# Patient Record
Sex: Female | Born: 1975 | Race: White | Hispanic: No | Marital: Married | Smoking: Never smoker
Health system: Southern US, Community
[De-identification: ages and names within clinical notes are randomized; demographics above are authoritative.]

## PROBLEM LIST (undated history)

## (undated) DIAGNOSIS — G43909 Migraine, unspecified, not intractable, without status migrainosus: Secondary | ICD-10-CM

## (undated) HISTORY — DX: Migraine, unspecified, not intractable, without status migrainosus: G43.909

## (undated) HISTORY — PX: ADENOIDECTOMY: SUR15

## (undated) HISTORY — PX: TONSILLECTOMY: SUR1361

---

## 2020-10-29 ENCOUNTER — Encounter (HOSPITAL_BASED_OUTPATIENT_CLINIC_OR_DEPARTMENT_OTHER): Payer: Self-pay | Admitting: Emergency Medicine

## 2020-10-29 ENCOUNTER — Emergency Department (HOSPITAL_BASED_OUTPATIENT_CLINIC_OR_DEPARTMENT_OTHER): Payer: BC Managed Care – PPO

## 2020-10-29 ENCOUNTER — Other Ambulatory Visit: Payer: Self-pay

## 2020-10-29 ENCOUNTER — Emergency Department (HOSPITAL_BASED_OUTPATIENT_CLINIC_OR_DEPARTMENT_OTHER)
Admission: EM | Admit: 2020-10-29 | Discharge: 2020-10-29 | Disposition: A | Discharge status: Home / Self Care | Payer: BC Managed Care – PPO | Attending: Emergency Medicine | Admitting: Emergency Medicine

## 2020-10-29 DIAGNOSIS — R202 Paresthesia of skin: Secondary | ICD-10-CM | POA: Diagnosis not present

## 2020-10-29 DIAGNOSIS — R519 Headache, unspecified: Secondary | ICD-10-CM | POA: Diagnosis not present

## 2020-10-29 DIAGNOSIS — R479 Unspecified speech disturbances: Secondary | ICD-10-CM | POA: Diagnosis not present

## 2020-10-29 DIAGNOSIS — H538 Other visual disturbances: Secondary | ICD-10-CM | POA: Diagnosis not present

## 2020-10-29 LAB — CBC WITH DIFFERENTIAL/PLATELET
Abs Immature Granulocytes: 0.01 10*3/uL (ref 0.00–0.07)
Basophils Absolute: 0 10*3/uL (ref 0.0–0.1)
Basophils Relative: 0 %
Eosinophils Absolute: 0.1 10*3/uL (ref 0.0–0.5)
Eosinophils Relative: 1 %
HCT: 40.9 % (ref 36.0–46.0)
Hemoglobin: 14 g/dL (ref 12.0–15.0)
Immature Granulocytes: 0 %
Lymphocytes Relative: 36 %
Lymphs Abs: 2.5 10*3/uL (ref 0.7–4.0)
MCH: 29.6 pg (ref 26.0–34.0)
MCHC: 34.2 g/dL (ref 30.0–36.0)
MCV: 86.5 fL (ref 80.0–100.0)
Monocytes Absolute: 0.4 10*3/uL (ref 0.1–1.0)
Monocytes Relative: 6 %
Neutro Abs: 3.9 10*3/uL (ref 1.7–7.7)
Neutrophils Relative %: 57 %
Platelets: 352 10*3/uL (ref 150–400)
RBC: 4.73 MIL/uL (ref 3.87–5.11)
RDW: 12.4 % (ref 11.5–15.5)
WBC: 7 10*3/uL (ref 4.0–10.5)
nRBC: 0 % (ref 0.0–0.2)

## 2020-10-29 LAB — COMPREHENSIVE METABOLIC PANEL
ALT: 19 U/L (ref 0–44)
AST: 18 U/L (ref 15–41)
Albumin: 4.1 g/dL (ref 3.5–5.0)
Alkaline Phosphatase: 43 U/L (ref 38–126)
Anion gap: 7 (ref 5–15)
BUN: 12 mg/dL (ref 6–20)
CO2: 24 mmol/L (ref 22–32)
Calcium: 8.9 mg/dL (ref 8.9–10.3)
Chloride: 107 mmol/L (ref 98–111)
Creatinine, Ser: 0.8 mg/dL (ref 0.44–1.00)
GFR, Estimated: >60 mL/min (ref >60)
Glucose, Bld: 140 mg/dL — ABNORMAL HIGH (ref 70–99)
Potassium: 3.3 mmol/L — ABNORMAL LOW (ref 3.5–5.1)
Sodium: 138 mmol/L (ref 135–145)
Total Bilirubin: 0.6 mg/dL (ref 0.3–1.2)
Total Protein: 7.1 g/dL (ref 6.5–8.1)

## 2020-10-29 LAB — CBG MONITORING, ED: Glucose-Capillary: 139 mg/dL — ABNORMAL HIGH (ref 70–99)

## 2020-10-29 NOTE — ED Provider Notes (Signed)
[facility] EMERGENCY DEPARTMENT Provider Note   CSN: [id number] Arrival date & time: 10/29/20  1936     History Chief Complaint  Patient presents with  . Neurologic Problem    Lindsay Farmer is a 45 y.o. female.  Patient states that several hours ago she started to have some spots in her eyes bilaterally and has some tingling in her hands and eventually developed some issues with talking and then developed a left-sided headache.  Has history of migraines.  Symptoms have now resolved.  She denies any weakness.  No ongoing vision issues.  The history is provided by the patient.  Headache Location: left forehead. Radiates to:  Does not radiate Severity currently:  0/10 Onset quality:  Gradual Duration:  2 hours Progression:  Resolved Chronicity:  New Relieved by:  Nothing Worsened by:  Nothing Associated symptoms: blurred vision, numbness (tingling) and paresthesias   Associated symptoms: no abdominal pain, no back pain, no cough, no dizziness, no ear pain, no eye pain, no fever, no photophobia, no seizures, no sore throat, no vomiting and no weakness       History reviewed. No pertinent past medical history.  There are no problems to display for this patient.   History reviewed. No pertinent surgical history.   OB History   No obstetric history on file.     History reviewed. No pertinent family history.  Social History   Tobacco Use  . Smoking status: Never    Passive exposure: Past  . Smokeless tobacco: Never  Vaping Use  . Vaping Use: Never used  Substance Use Topics  . Drug use: Never    Home Medications Prior to Admission medications   Medication Sig Start Date End Date Taking? Authorizing Provider  ALTAVERA 0.15-30 MG-MCG tablet Take 1 tablet by mouth daily. 08/22/20   [provider]    Allergies    Cephalexin  Review of Systems   Review of Systems  Constitutional:  Negative for chills and fever.  HENT:  Negative for ear  pain and sore throat.   Eyes:  Positive for blurred vision and visual disturbance. Negative for photophobia, pain, redness and itching.  Respiratory:  Negative for cough and shortness of breath.   Cardiovascular:  Negative for chest pain and palpitations.  Gastrointestinal:  Negative for abdominal pain and vomiting.  Genitourinary:  Negative for dysuria and hematuria.  Musculoskeletal:  Negative for arthralgias and back pain.  Skin:  Negative for color change and rash.  Neurological:  Positive for speech difficulty, numbness (tingling), headaches and paresthesias. Negative for dizziness, tremors, seizures, syncope, facial asymmetry, weakness and light-headedness.  All other systems reviewed and are negative.  Physical Exam Updated Vital Signs BP 126/90   Pulse 81   Temp 99.1 F (37.3 C) (Oral)   Resp 20   Ht 5\' 3"  (1.6 m)   Wt 71.4 kg   LMP 10/06/2020 (Exact Date)   SpO2 100%   BMI 27.88 kg/m   Physical Exam Vitals and nursing note reviewed.  Constitutional:      General: She is not in acute distress.    Appearance: She is well-developed. She is not ill-appearing.  HENT:     Head: Normocephalic and atraumatic.     Nose: Nose normal.     Mouth/Throat:     Mouth: Mucous membranes are moist.  Eyes:     Extraocular Movements: Extraocular movements intact.     Conjunctiva/sclera: Conjunctivae normal.     Pupils: Pupils are equal, round,  and reactive to light.  Cardiovascular:     Rate and Rhythm: Normal rate and regular rhythm.     Pulses: Normal pulses.     Heart sounds: Normal heart sounds. No murmur heard. Pulmonary:     Effort: Pulmonary effort is normal. No respiratory distress.     Breath sounds: Normal breath sounds.  Abdominal:     Palpations: Abdomen is soft.     Tenderness: There is no abdominal tenderness.  Musculoskeletal:     Cervical back: Normal range of motion and neck supple.  Skin:    General: Skin is warm and dry.     Capillary Refill: Capillary  refill takes less than 2 seconds.  Neurological:     General: No focal deficit present.     Mental Status: She is alert and oriented to person, place, and time.     Cranial Nerves: No cranial nerve deficit.     Sensory: No sensory deficit.     Motor: No weakness.     Coordination: Coordination normal.     Gait: Gait normal.     Comments: 5+ out of 5 strength throughout, normal sensation, no drift, normal finger-to-nose finger, normal speech, no visual field deficit  Psychiatric:        Mood and Affect: Mood normal.    ED Results / Procedures / Treatments   Labs (all labs ordered are listed, but only abnormal results are displayed) Labs Reviewed  COMPREHENSIVE METABOLIC PANEL - Abnormal; Notable for the following components:      Result Value   Potassium 3.3 (*)    Glucose, Bld 140 (*)    All other components within normal limits  CBG MONITORING, ED - Abnormal; Notable for the following components:   Glucose-Capillary 139 (*)    All other components within normal limits  CBC WITH DIFFERENTIAL/PLATELET    EKG EKG Interpretation  Date/Time:  Thursday October 29 2020 20:06:08 EDT Ventricular Rate:  74 PR Interval:  143 QRS Duration: 89 QT Interval:  391 QTC Calculation: 434 R Axis:   82 Text Interpretation: Sinus rhythm Confirmed by Virgina Norfolk ([id number]) on 10/29/2020 8:23:17 PM  Radiology CT Head Wo Contrast  Result Date: 10/29/2020 CLINICAL DATA:  Headache, blurred vision. Transient aphasia, right hand tingling and left supraorbital headache. History of migraines. EXAM: CT HEAD WITHOUT CONTRAST TECHNIQUE: Contiguous axial images were obtained from the base of the skull through the vertex without intravenous contrast. COMPARISON:  None. FINDINGS: Brain: No evidence of acute infarction, hemorrhage, hydrocephalus, extra-axial collection, visible mass lesion or mass effect. Basal cisterns are patent. Midline intracranial structures are unremarkable. Cerebellar tonsils are normally  positioned. Vascular: No hyperdense vessel or unexpected calcification. Skull: No calvarial fracture or suspicious osseous lesion. No scalp swelling or hematoma. Sinuses/Orbits: Paranasal sinuses and mastoid air cells are predominantly clear. Other: None. IMPRESSION: No acute intracranial abnormality. Electronically Signed   By: Kreg Shropshire M.D.   On: 10/29/2020 20:28    Procedures Procedures   Medications Ordered in ED Medications - No data to display  ED Course  I have reviewed the triage vital signs and the nursing notes.  Pertinent labs & imaging results that were available during my care of the patient were reviewed by me and considered in my medical decision making (see chart for details).    MDM Rules/Calculators/A&P                          Lunabelle Oatley is here  with neurologic issue.  Patient 45 years old.  No medical history.  Normal vitals.  No fever.  Has a history of migraines long time ago but has not had any in a long time.  She started to have some floaters/spots in both of her eyes earlier this afternoon.  Then she started to have some tingling in her right hand and then was having some issues with speech and then shortly after that developed a headache over her left eye once all of her other symptoms resolved.  Right now she is not having any headache.  She is completely neurologically intact without any symptoms.  Overall seems like likely complex migraine.  She has no stroke risk factors.  History is not really consistent with a TIA and overall atypical story for it.  Could be stress or anxiety related.  Lab work showed no significant anemia, no electrolyte abnormality.  Head CT normal.  Asymptomatic throughout my care.  Given reassurance and recommend follow-up with primary care doctor.  Discharged in good condition.  This chart was dictated using voice recognition software.  Despite best efforts to proofread,  errors can occur which can change the documentation meaning.    Final Clinical Impression(s) / ED Diagnoses Final diagnoses:  Nonintractable headache, unspecified chronicity pattern, unspecified headache type    Rx / DC Orders ED Discharge Orders     None        Virgina Norfolk, DO 10/29/20 2350

## 2020-10-29 NOTE — ED Triage Notes (Signed)
Patient arrived via POV c/o stroke like symptoms starting at approximately 1515, with blurred vision and resolving by 1630. Patient experienced aphasia, tingling of right hand and HA above left eye. Patient denies any pain at this time. Patient is AO x 4, VS WDL, normal gait.

## 2022-04-27 ENCOUNTER — Ambulatory Visit
Admission: EM | Admit: 2022-04-27 | Discharge: 2022-04-27 | Disposition: A | Discharge status: Home / Self Care | Payer: BC Managed Care – PPO

## 2022-04-27 DIAGNOSIS — B349 Viral infection, unspecified: Secondary | ICD-10-CM | POA: Diagnosis not present

## 2022-04-27 DIAGNOSIS — H6993 Unspecified Eustachian tube disorder, bilateral: Secondary | ICD-10-CM

## 2022-04-27 MED ORDER — FLUTICASONE PROPIONATE 50 MCG/ACT NA SUSP: 2.0000 | Freq: Every day | NASAL | 0 refills | Status: AC

## 2022-04-27 NOTE — ED Provider Notes (Signed)
[facility]    CSN: [id number] Arrival date & time: 04/27/22  1750      History   Chief Complaint Chief Complaint  Patient presents with   Otalgia    HPI Lindsay Farmer is a 46 y.o. female.   HPI  Patient's had viral symptoms for over a week.  Over the weekend she started having ear pressure and discomfort.  Decreased hearing at times.  Wants to have her ears checked.  She states one of her children had strep, one of her children had influenza.  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  Currently not having any fever or chills  Past Medical History:  Diagnosis Date   Migraines     There are no problems to display for this patient.   Past Surgical History:  Procedure Laterality Date   ADENOIDECTOMY     CESAREAN SECTION     TONSILLECTOMY      OB History   No obstetric history on file.      Home Medications    Prior to Admission medications   Medication Sig Start Date End Date Taking? Authorizing Provider  butalbital-acetaminophen-caffeine (FIORICET) 50-325-40 MG tablet Take by mouth 2 (two) times daily as needed for headache.   Yes [provider]  fluticasone (FLONASE) 50 MCG/ACT nasal spray Place 2 sprays into both nostrils daily. 04/27/22  Yes Eustace Moore, MD  ALTAVERA 0.15-30 MG-MCG tablet Take 1 tablet by mouth daily. 08/22/20   [provider]    Family History History reviewed. No pertinent family history.  Social History Social History   Tobacco Use   Smoking status: Never    Passive exposure: Past   Smokeless tobacco: Never  Vaping Use   Vaping Use: Never used  Substance Use Topics   Drug use: Never     Allergies   Cephalexin   Review of Systems Review of Systems  See HPI Physical Exam Triage Vital Signs ED Triage Vitals  Enc Vitals Group     BP 04/27/22 1800 (!) 145/96     Pulse Rate 04/27/22 1800 78     Resp 04/27/22 1800 14     Temp 04/27/22 1800 99 F (37.2 C)      Temp Source 04/27/22 1800 Oral     SpO2 04/27/22 1800 96 %     Weight --      Height --      Head Circumference --      Peak Flow --      Pain Score 04/27/22 1757 0     Pain Loc --      Pain Edu? --      Excl. in GC? --    No data found.  Updated Vital Signs BP (!) 145/96 (BP Location: Right Arm)   Pulse 78   Temp 99 F (37.2 C) (Oral)   Resp 14   SpO2 96%      Physical Exam Constitutional:      General: She is not in acute distress.    Appearance: She is well-developed. She is not ill-appearing.  HENT:     Head: Normocephalic and atraumatic.     Right Ear: Tympanic membrane normal.     Left Ear: Tympanic membrane normal.     Nose: No congestion or rhinorrhea.     Mouth/Throat:     Pharynx: Posterior oropharyngeal erythema present.     Comments: Small ulcers on posterior pharynx and tonsillar pillars Eyes:  Conjunctiva/sclera: Conjunctivae normal.     Pupils: Pupils are equal, round, and reactive to light.  Cardiovascular:     Rate and Rhythm: Normal rate.  Pulmonary:     Effort: Pulmonary effort is normal. No respiratory distress.  Abdominal:     General: There is no distension.     Palpations: Abdomen is soft.  Musculoskeletal:        General: Normal range of motion.     Cervical back: Normal range of motion.  Lymphadenopathy:     Cervical: Cervical adenopathy present.  Skin:    General: Skin is warm and dry.  Neurological:     Mental Status: She is alert.      UC Treatments / Results  Labs (all labs ordered are listed, but only abnormal results are displayed) Labs Reviewed - No data to display  EKG   Radiology No results found.  Procedures Procedures (including critical care time)  Medications Ordered in UC Medications - No data to display  Initial Impression / Assessment and Plan / UC Course  I have reviewed the triage vital signs and the nursing notes.  Pertinent labs & imaging results that were available during my care of the  patient were reviewed by me and considered in my medical decision making (see chart for details).     Final Clinical Impressions(s) / UC Diagnoses   Final diagnoses:  Eustachian tube dysfunction, bilateral  Viral illness     Discharge Instructions      Use of Flonase 2 times a day for couple of days then reduce to once a day.  Use until symptoms have resolved  Drink lots of water  See your primary care if you fail to improve.  May call or return here if needed   ED Prescriptions     Medication Sig Dispense Auth. Provider   fluticasone (FLONASE) 50 MCG/ACT nasal spray Place 2 sprays into both nostrils daily. 16 g Eustace Moore, MD      PDMP not reviewed this encounter.   Eustace Moore, MD 04/27/22 [phone]

## 2022-04-27 NOTE — ED Triage Notes (Signed)
Pt presents with c/o otalgia that began over the weekend after having a viral infection last week. Pt states she had positive flu contact.

## 2022-04-27 NOTE — Discharge Instructions (Signed)
Use of Flonase 2 times a day for couple of days then reduce to once a day.  Use until symptoms have resolved  Drink lots of water  See your primary care if you fail to improve.  May call or return here if needed

## 2023-02-17 IMAGING — CT CT HEAD W/O CM
3 series · 16 of 47 positions shown, 19 images · non-contrast
Comparison: None.

CLINICAL DATA: Headache, blurred vision. Transient aphasia, right
hand tingling and left supraorbital headache. History of migraines.

EXAM:
CT HEAD WITHOUT CONTRAST
TECHNIQUE: Contiguous axial images were obtained from the base of the skull
through the vertex without intravenous contrast.

[Series 2: head wo · axial · 0.45mm/px · z∈[-42,+88]mm · 10 of 32 slices shown, 13 images]
[im 3/32  brain]
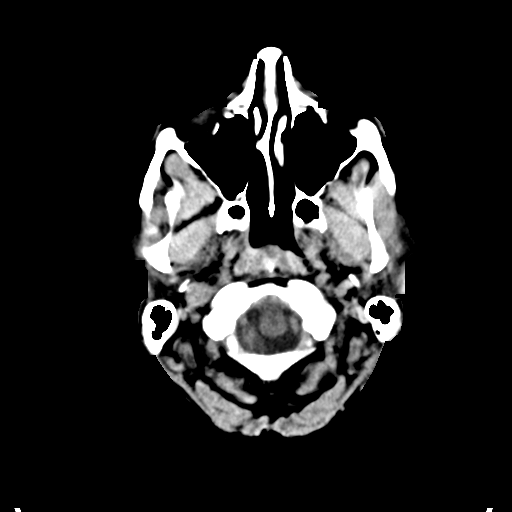
[im 3/32  bone]
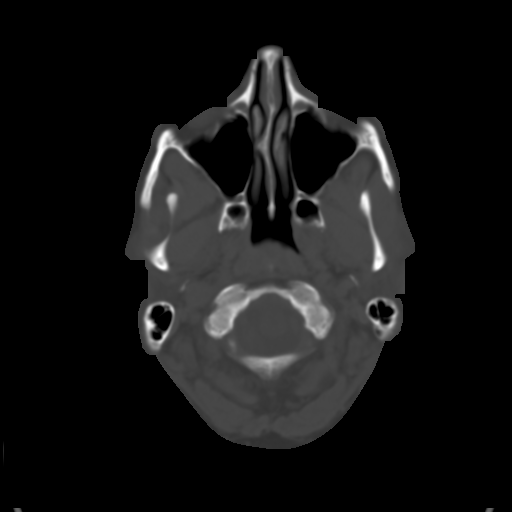
[im 6/32  brain]
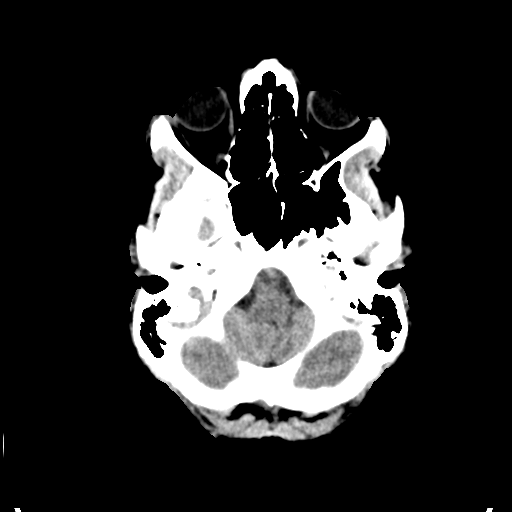
[im 9/32  brain]
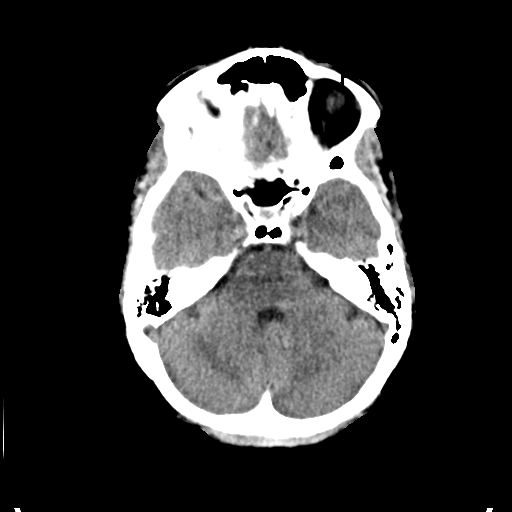
[im 11/32  brain]
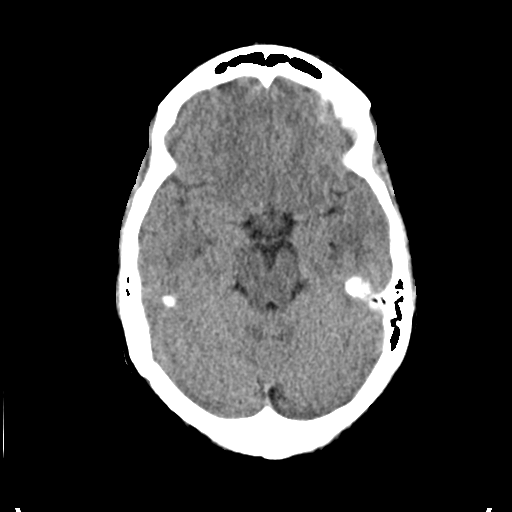
[im 14/32  brain]
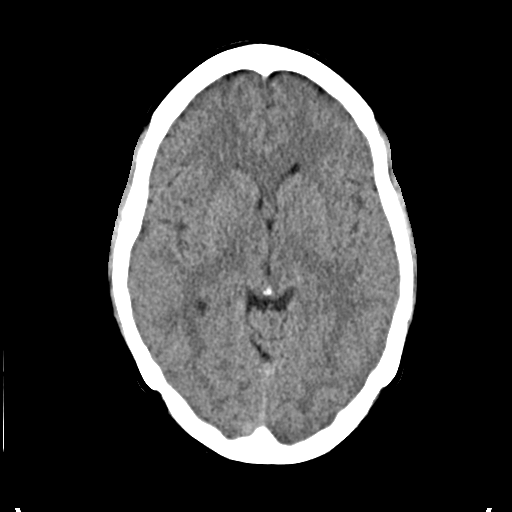
[im 14/32  bone]
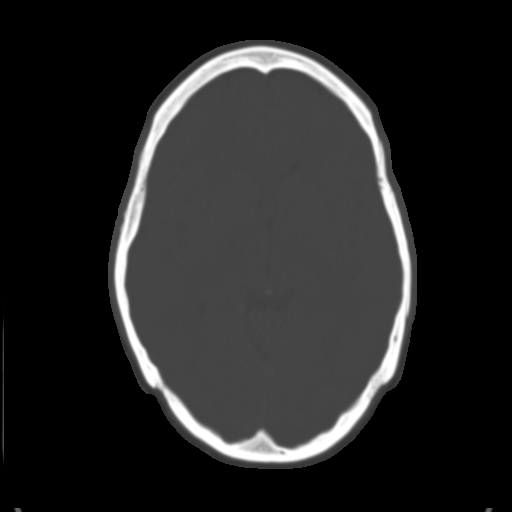
[im 18/32  brain]
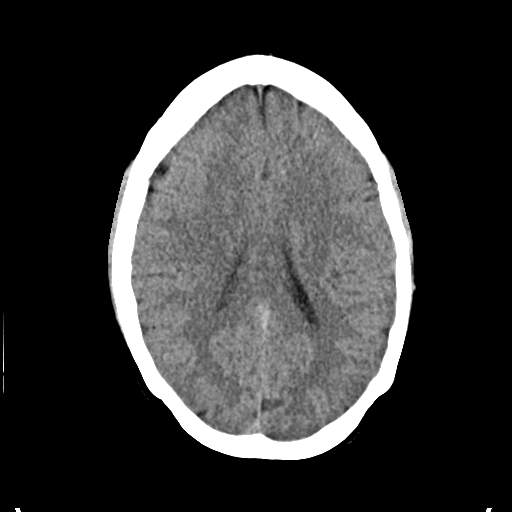
[im 21/32  brain]
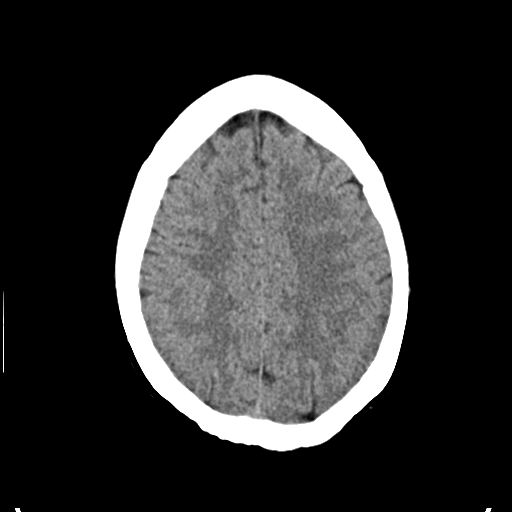
[im 24/32  brain]
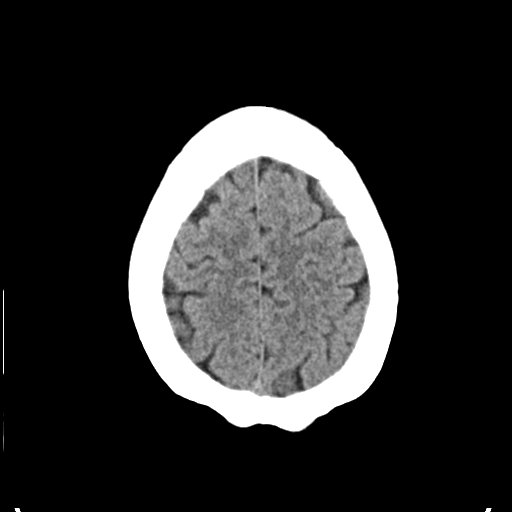
[im 26/32  brain]
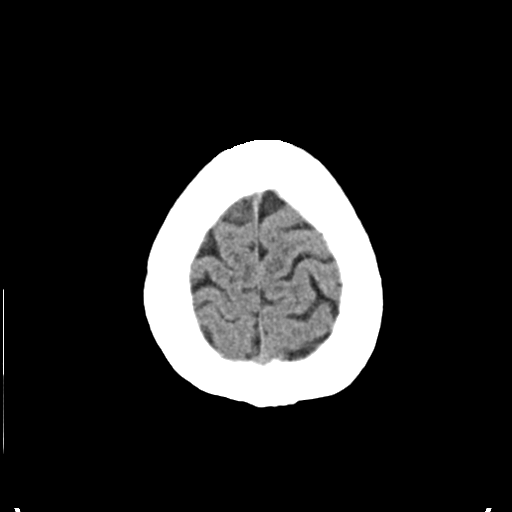
[im 26/32  bone]
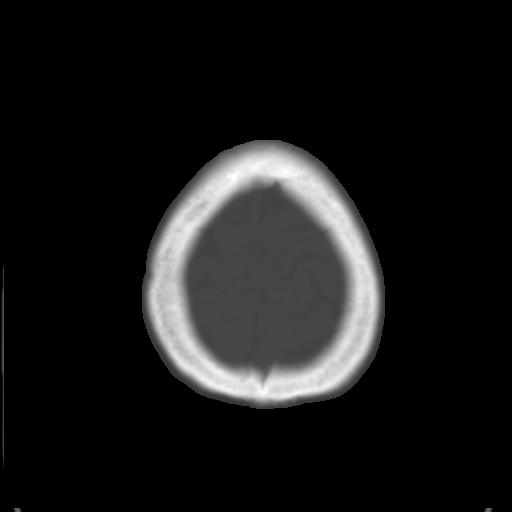
[im 29/32  brain]
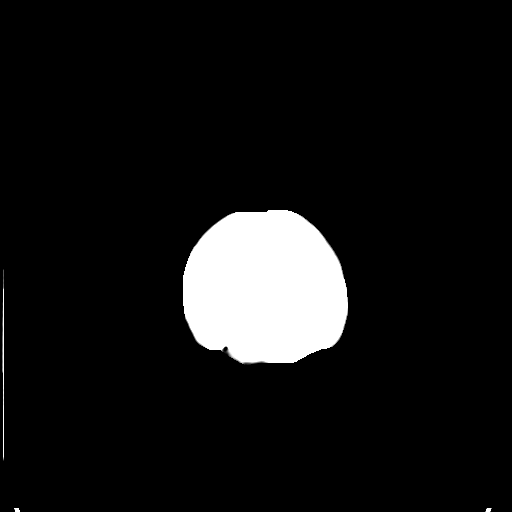

[Series 4: coronal soft · coronal · 0.31mm/px · 3 of 71 slices shown]
[im 24/71  brain]
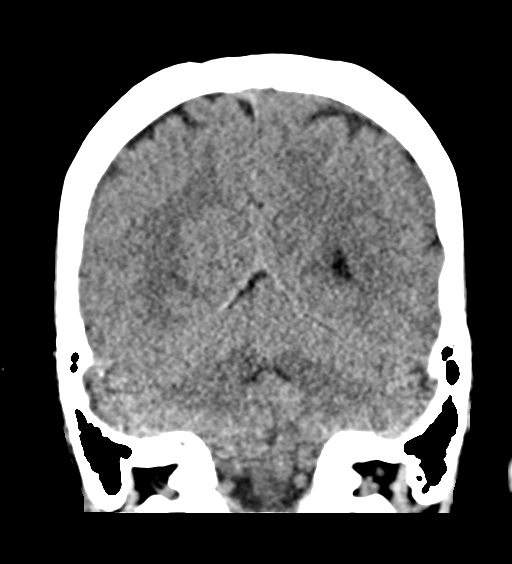
[im 32/71  brain]
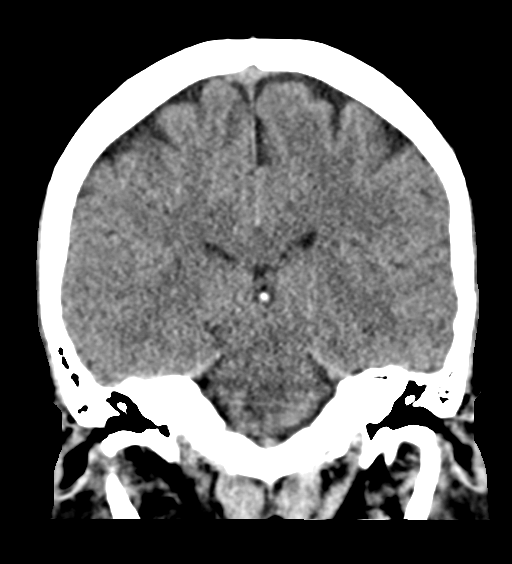
[im 39/71  brain]
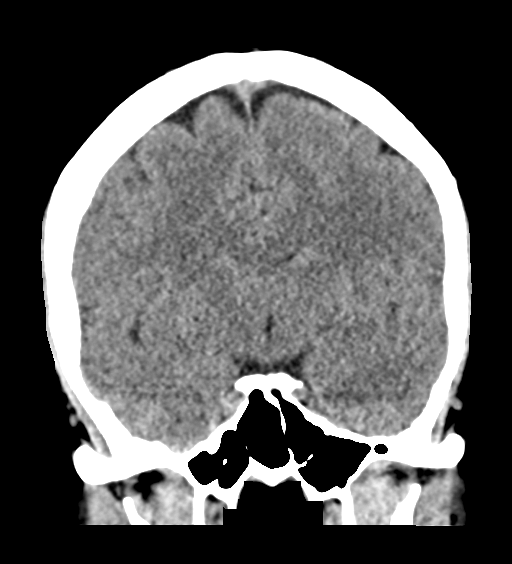

[Series 5: sag soft · sagittal · 0.34mm/px · 3 of 54 slices shown]
[im 18/54  brain]
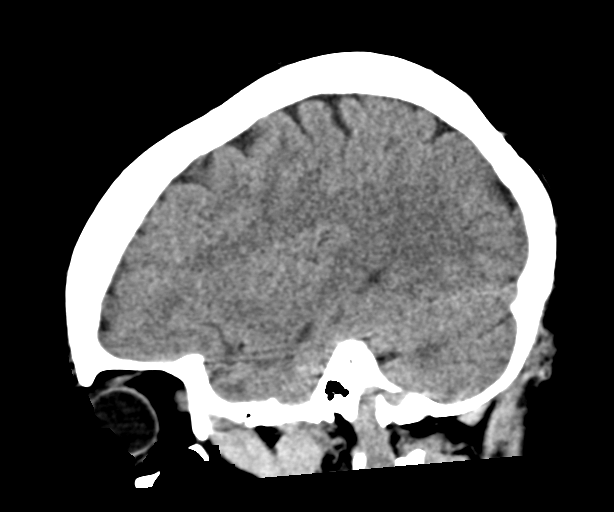
[im 27/54  brain]
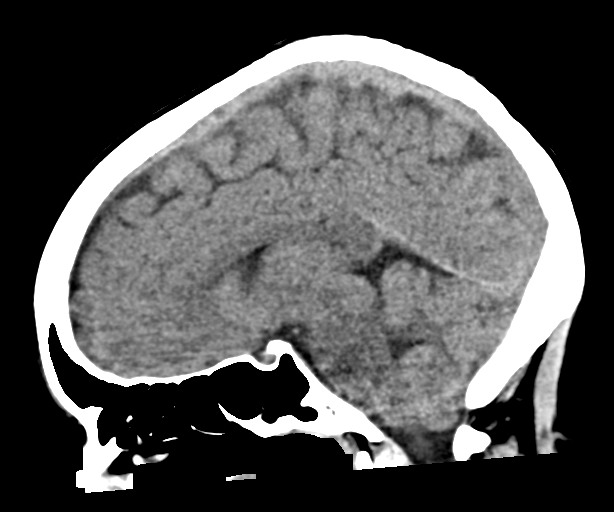
[im 36/54  brain]
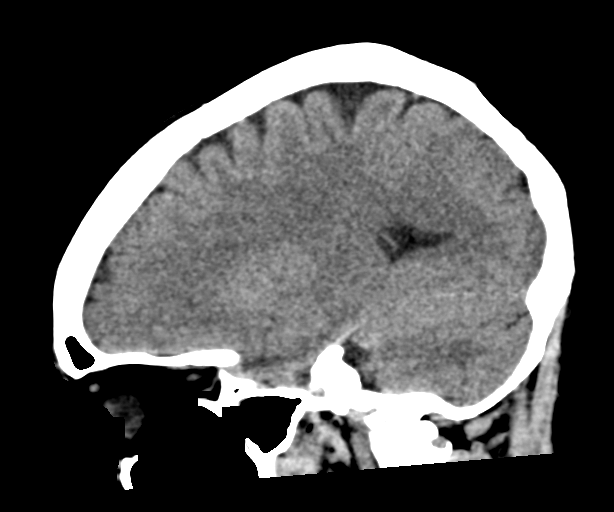

[16 of 47 positions shown; findings below may reference images not displayed]

FINDINGS: Brain: No evidence of acute infarction, hemorrhage, hydrocephalus,
extra-axial collection, visible mass lesion or mass effect. Basal
cisterns are patent. Midline intracranial structures are
unremarkable. Cerebellar tonsils are normally positioned.

Vascular: No hyperdense vessel or unexpected calcification.

Skull: No calvarial fracture or suspicious osseous lesion. No scalp
swelling or hematoma.

Sinuses/Orbits: Paranasal sinuses and mastoid air cells are
predominantly clear.

Other: None.
IMPRESSION: No acute intracranial abnormality.
